# Patient Record
Sex: Female | Born: 1960 | Race: Black or African American | Hispanic: No | Marital: Married | Smoking: Never smoker
Health system: Southern US, Community
[De-identification: ages and names within clinical notes are randomized; demographics above are authoritative.]

## PROBLEM LIST (undated history)

## (undated) DIAGNOSIS — I10 Essential (primary) hypertension: Secondary | ICD-10-CM

## (undated) HISTORY — PX: ECTOPIC PREGNANCY SURGERY: SHX613

## (undated) HISTORY — PX: ABDOMINAL HYSTERECTOMY: SHX81

---

## 2013-07-30 DIAGNOSIS — J45909 Unspecified asthma, uncomplicated: Secondary | ICD-10-CM | POA: Insufficient documentation

## 2013-09-26 ENCOUNTER — Ambulatory Visit: Payer: Self-pay | Admitting: Family Medicine

## 2016-01-05 ENCOUNTER — Emergency Department
Admission: EM | Admit: 2016-01-05 | Discharge: 2016-01-05 | Disposition: A | Discharge status: Home / Self Care | Payer: Managed Care, Other (non HMO) | Attending: Emergency Medicine | Admitting: Emergency Medicine

## 2016-01-05 ENCOUNTER — Encounter: Payer: Self-pay | Admitting: Emergency Medicine

## 2016-01-05 ENCOUNTER — Emergency Department: Payer: Managed Care, Other (non HMO)

## 2016-01-05 DIAGNOSIS — I10 Essential (primary) hypertension: Secondary | ICD-10-CM | POA: Diagnosis not present

## 2016-01-05 DIAGNOSIS — R079 Chest pain, unspecified: Secondary | ICD-10-CM

## 2016-01-05 HISTORY — DX: Essential (primary) hypertension: I10

## 2016-01-05 LAB — BASIC METABOLIC PANEL
ANION GAP: 8 (ref 5–15)
BUN: 15 mg/dL (ref 6–20)
CHLORIDE: 97 mmol/L — AB (ref 101–111)
CO2: 27 mmol/L (ref 22–32)
Calcium: 9.9 mg/dL (ref 8.9–10.3)
Creatinine, Ser: 0.79 mg/dL (ref 0.44–1.00)
GFR calc non Af Amer: >60 mL/min (ref >60)
GLUCOSE: 104 mg/dL — AB (ref 65–99)
Potassium: 3.4 mmol/L — ABNORMAL LOW (ref 3.5–5.1)
Sodium: 132 mmol/L — ABNORMAL LOW (ref 135–145)

## 2016-01-05 LAB — CBC
HEMATOCRIT: 36.4 % (ref 35.0–47.0)
HEMOGLOBIN: 12.5 g/dL (ref 12.0–16.0)
MCH: 29.5 pg (ref 26.0–34.0)
MCHC: 34.4 g/dL (ref 32.0–36.0)
MCV: 85.7 fL (ref 80.0–100.0)
Platelets: 305 10*3/uL (ref 150–440)
RBC: 4.25 MIL/uL (ref 3.80–5.20)
RDW: 13.4 % (ref 11.5–14.5)
WBC: 5.5 10*3/uL (ref 3.6–11.0)

## 2016-01-05 LAB — TROPONIN I: Troponin I: <0.03 ng/mL (ref <0.03)

## 2016-01-05 NOTE — ED Triage Notes (Signed)
Patient ambulatory to triage with steady gait, without difficulty or distress noted; pt reports elevated BP at home with pain beneath left breast radiating into back; st BP 179/99 at home

## 2016-01-05 NOTE — ED Provider Notes (Signed)
[facility] Emergency Department Provider Note   ____________________________________________   First MD Initiated Contact with Patient 01/05/16 0451     (approximate)  I have reviewed the triage vital signs and the nursing notes.   HISTORY  Chief Complaint Hypertension    HPI Gloria Curry is a 55 y.o. female who comes into the hospital today with elevated blood pressure and chest pain. The patient reports her blood pressure went up and she had pain under her left breast. The patient reports that her back is hurting a little bit as well. She's been taking her blood pressure medication okay but she hasn't had new problems. The patient reports her pain is gone currently and her blood pressure is improved. The patient is a pressure medicine one today but was nervous when it was so high. The patient denies any shortness of breath, arm pain, sweats, dizziness, lightheadedness nausea or vomiting. The patient did take some Aleve for her pain but it didn't help with her pain much. The patient's primary care physician is Dr. Dyke BrackettBabboff the patient is here today for evaluation.The patient's blood pressure at home was 179/99.   Past Medical History:  Diagnosis Date  . Hypertension     There are no active problems to display for this patient.   Past Surgical History:  Procedure Laterality Date  . ABDOMINAL HYSTERECTOMY    . ECTOPIC PREGNANCY SURGERY      Prior to Admission medications   Not on File    Allergies Patient has no known allergies.  No family history on file.  Social History Social History  Substance Use Topics  . Smoking status: Never Smoker  . Smokeless tobacco: Never Used  . Alcohol use No    Review of Systems Constitutional: Hypertension Eyes: No visual changes. ENT: No sore throat. Cardiovascular:  chest pain. Respiratory: Denies shortness of breath. Gastrointestinal: No abdominal pain.  No nausea, no vomiting.  No diarrhea.  No  constipation. Genitourinary: Negative for dysuria. Musculoskeletal: Negative for back pain. Skin: Negative for rash. Neurological: Negative for headaches, focal weakness or numbness.  10-point ROS otherwise negative.  ____________________________________________   PHYSICAL EXAM:  VITAL SIGNS: ED Triage Vitals  Enc Vitals Group     BP 01/05/16 0115 (!) 192/91     Pulse Rate 01/05/16 0115 84     Resp 01/05/16 0115 18     Temp 01/05/16 0115 97.8 F (36.6 C)     Temp Source 01/05/16 0115 Oral     SpO2 01/05/16 0115 100 %     Weight 01/05/16 0116 125 lb (56.7 kg)     Height 01/05/16 0116 5\' 2"  (1.575 m)     Head Circumference --      Peak Flow --      Pain Score 01/05/16 0115 7     Pain Loc --      Pain Edu? --      Excl. in [place]? --     Constitutional: Alert and oriented. Well appearing and in no acute distress. Eyes: Conjunctivae are normal. PERRL. EOMI. Head: Atraumatic. Nose: No congestion/rhinnorhea. Mouth/Throat: Mucous membranes are moist.  Oropharynx non-erythematous. Cardiovascular: Normal rate, regular rhythm. Grossly normal heart sounds.  Good peripheral circulation. Respiratory: Normal respiratory effort.  No retractions. Lungs CTAB. Gastrointestinal: Soft and nontender. No distention. Positive bowel sounds Musculoskeletal: No lower extremity tenderness nor edema.   Neurologic:  Normal speech and language.  Skin:  Skin is warm, dry and intact.  Psychiatric: Mood and affect  are normal.   ____________________________________________   LABS (all labs ordered are listed, but only abnormal results are displayed)  Labs Reviewed  BASIC METABOLIC PANEL - Abnormal; Notable for the following:       Result Value   Sodium 132 (*)    Potassium 3.4 (*)    Chloride 97 (*)    Glucose, Bld 104 (*)    All other components within normal limits  CBC  TROPONIN I  TROPONIN I   ____________________________________________  EKG  ED ECG REPORT I, Rebecka ApleyWebster,  Cristle Jared P,  the attending physician, personally viewed and interpreted this ECG.   Date: 01/05/2016  EKG Time: 130  Rate: 88  Rhythm: normal sinus rhythm  Axis: normal  Intervals:none  ST&T Change: none  ____________________________________________  RADIOLOGY  CXR ____________________________________________   PROCEDURES  Procedure(s) performed: None  Procedures  Critical Care performed: No  ____________________________________________   INITIAL IMPRESSION / ASSESSMENT AND PLAN / ED COURSE  Pertinent labs & imaging results that were available during my care of the patient were reviewed by me and considered in my medical decision making (see chart for details).  This is a 55 year old female who comes into the hospital today with some elevated blood pressure and chest pain. The patient's initial blood work is unremarkable and her blood pressure is improved. The patient's chest pain is also improved at this time. I will repeat the patient's troponin and if her blood pressure remains unremarkable she'll be discharged home.  Clinical Course    Chest x-ray: No active cardiopulmonary disease  The patient's chest pain is improved and her blood pressure is still in the 130s over 90s. She'll be discharged home to follow-up with her primary care physician. The patient has no further complaints or concerns at this time. She'll be discharged.  ____________________________________________   FINAL CLINICAL IMPRESSION(S) / ED DIAGNOSES  Final diagnoses:  Chest pain, unspecified type  Hypertension, unspecified type      NEW MEDICATIONS STARTED DURING THIS VISIT:  There are no discharge medications for this patient.    Note:  This document was prepared using Dragon voice recognition software and may include unintentional dictation errors.    Rebecka ApleyAllison P Henery Betzold, MD 01/05/16 [phone]

## 2016-10-31 ENCOUNTER — Other Ambulatory Visit: Payer: Self-pay | Admitting: Family Medicine

## 2016-10-31 DIAGNOSIS — Z1231 Encounter for screening mammogram for malignant neoplasm of breast: Secondary | ICD-10-CM

## 2016-11-10 ENCOUNTER — Ambulatory Visit
Admission: RE | Admit: 2016-11-10 | Discharge: 2016-11-10 | Disposition: A | Discharge status: Home / Self Care | Payer: Managed Care, Other (non HMO) | Source: Ambulatory Visit | Attending: Family Medicine | Admitting: Family Medicine

## 2016-11-10 DIAGNOSIS — Z1231 Encounter for screening mammogram for malignant neoplasm of breast: Secondary | ICD-10-CM | POA: Diagnosis not present

## 2017-11-27 ENCOUNTER — Other Ambulatory Visit: Payer: Self-pay | Admitting: Family Medicine

## 2017-11-27 DIAGNOSIS — Z1231 Encounter for screening mammogram for malignant neoplasm of breast: Secondary | ICD-10-CM

## 2017-12-30 IMAGING — CR DG CHEST 2V
2 series · 3 of 3 positions shown · non-contrast
Comparison: None.

CLINICAL DATA: Elevated BP pain beneath the left breast

EXAM:
CHEST  2 VIEW

[chest pa]
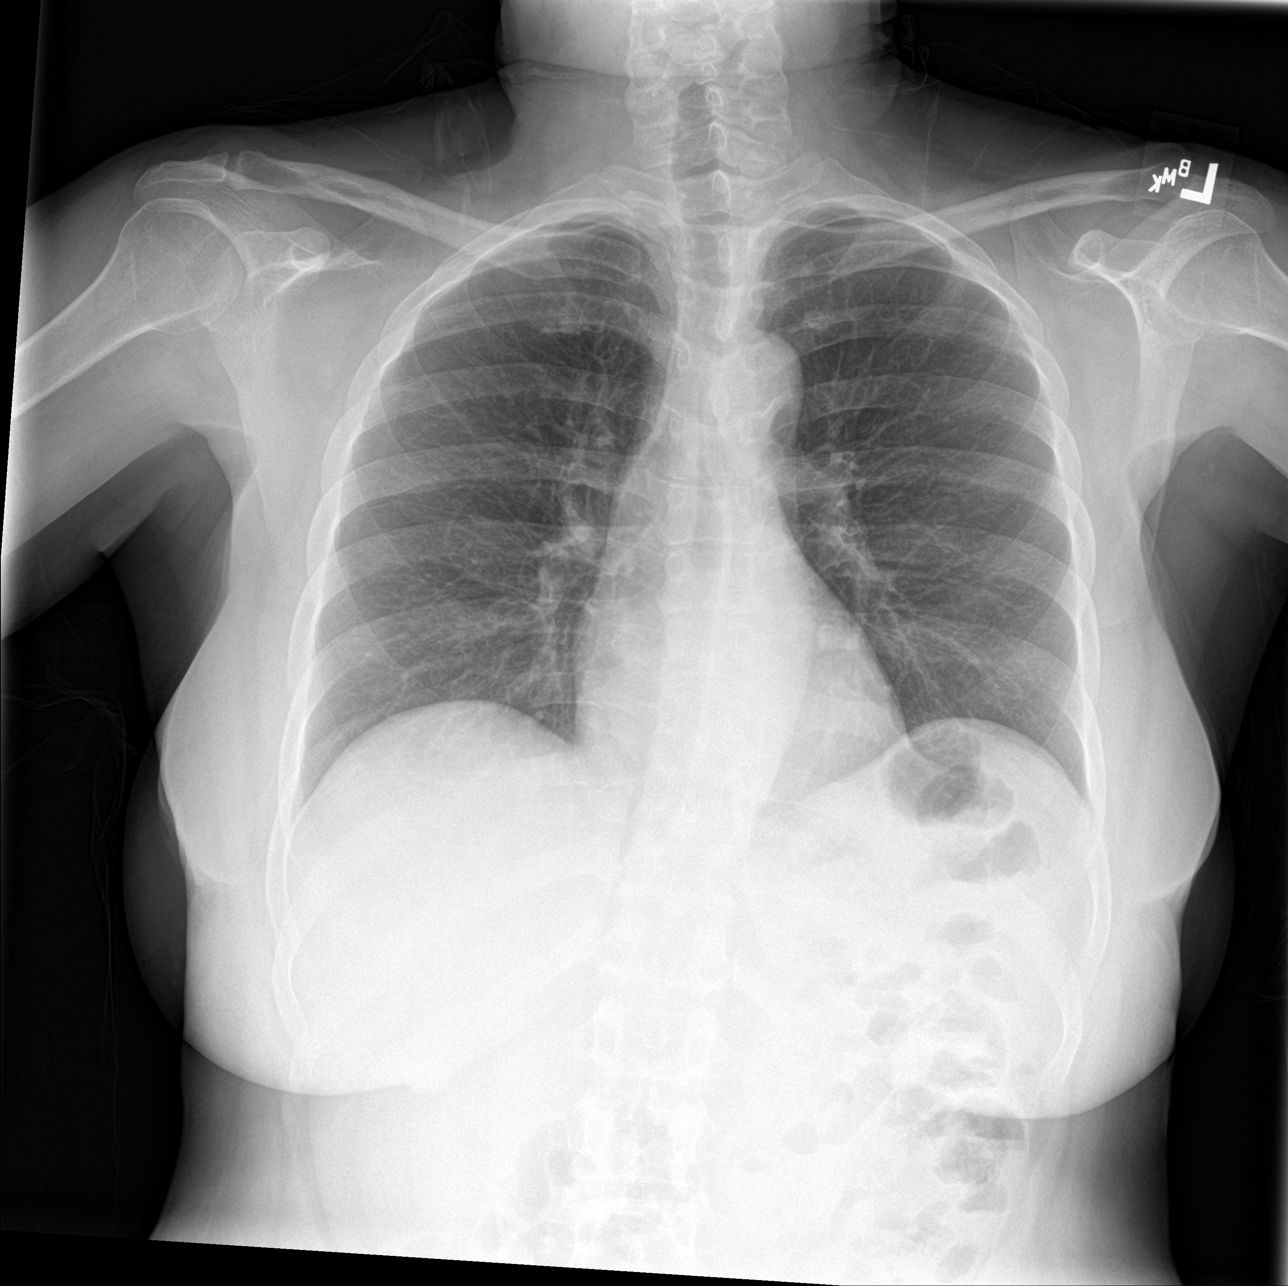

[Series 2: chest lat · 0.14mm/px · 2 of 2 slices shown]
[im 1/2]
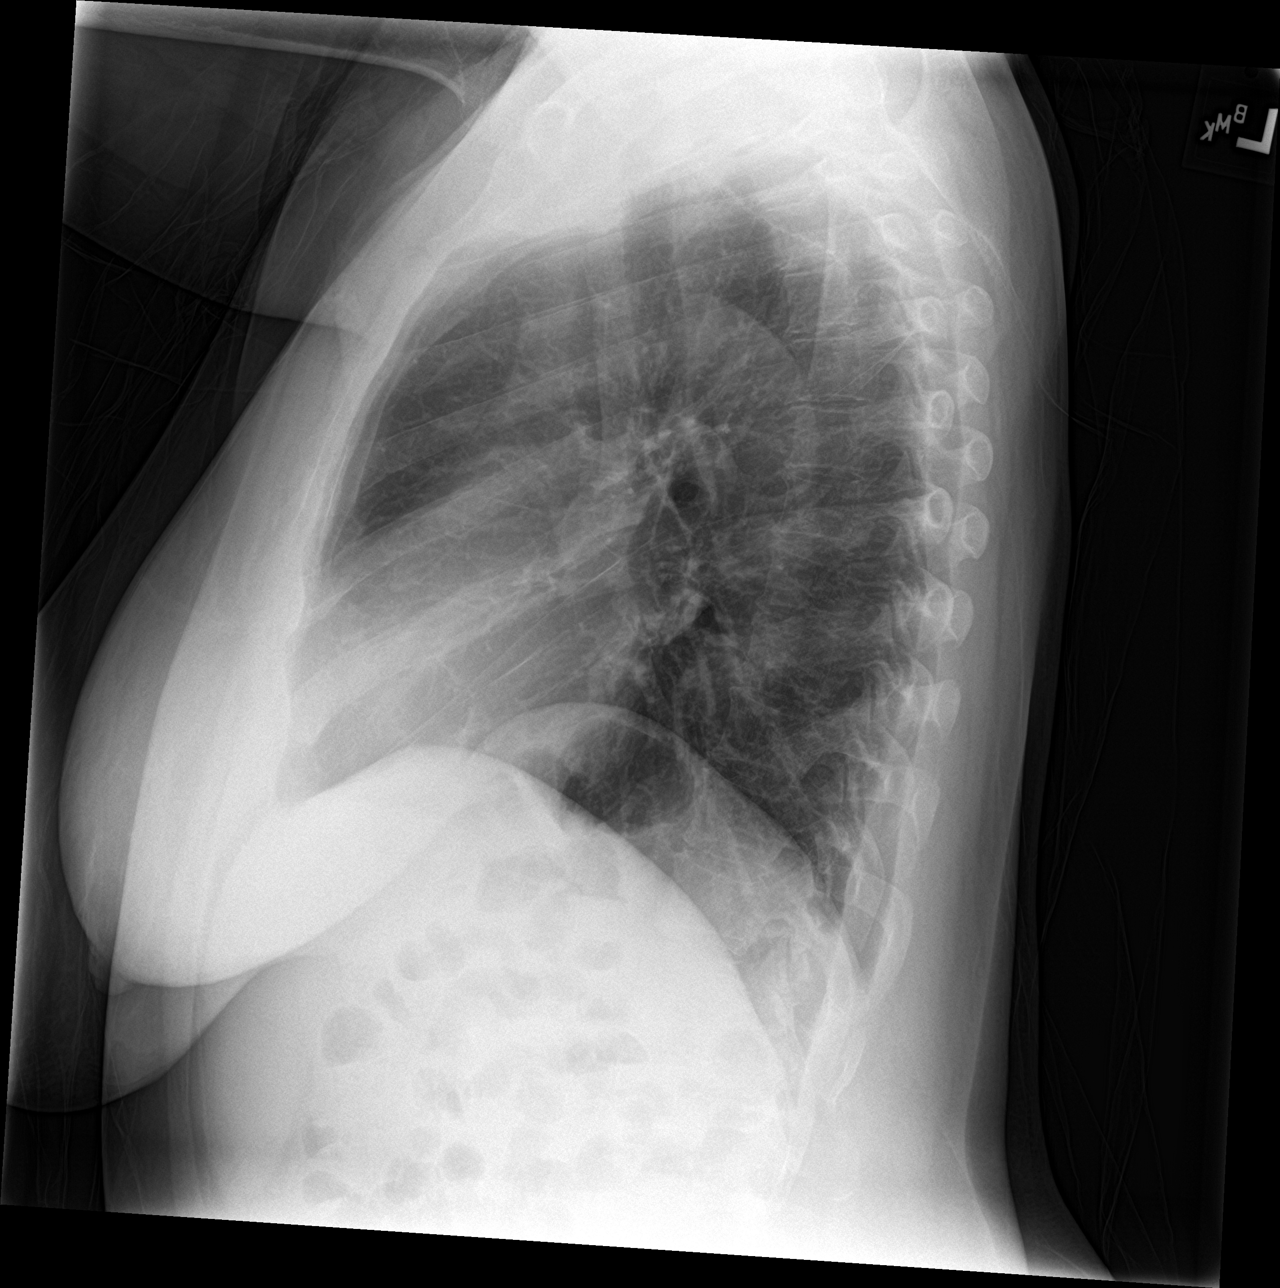
[im 2/2]
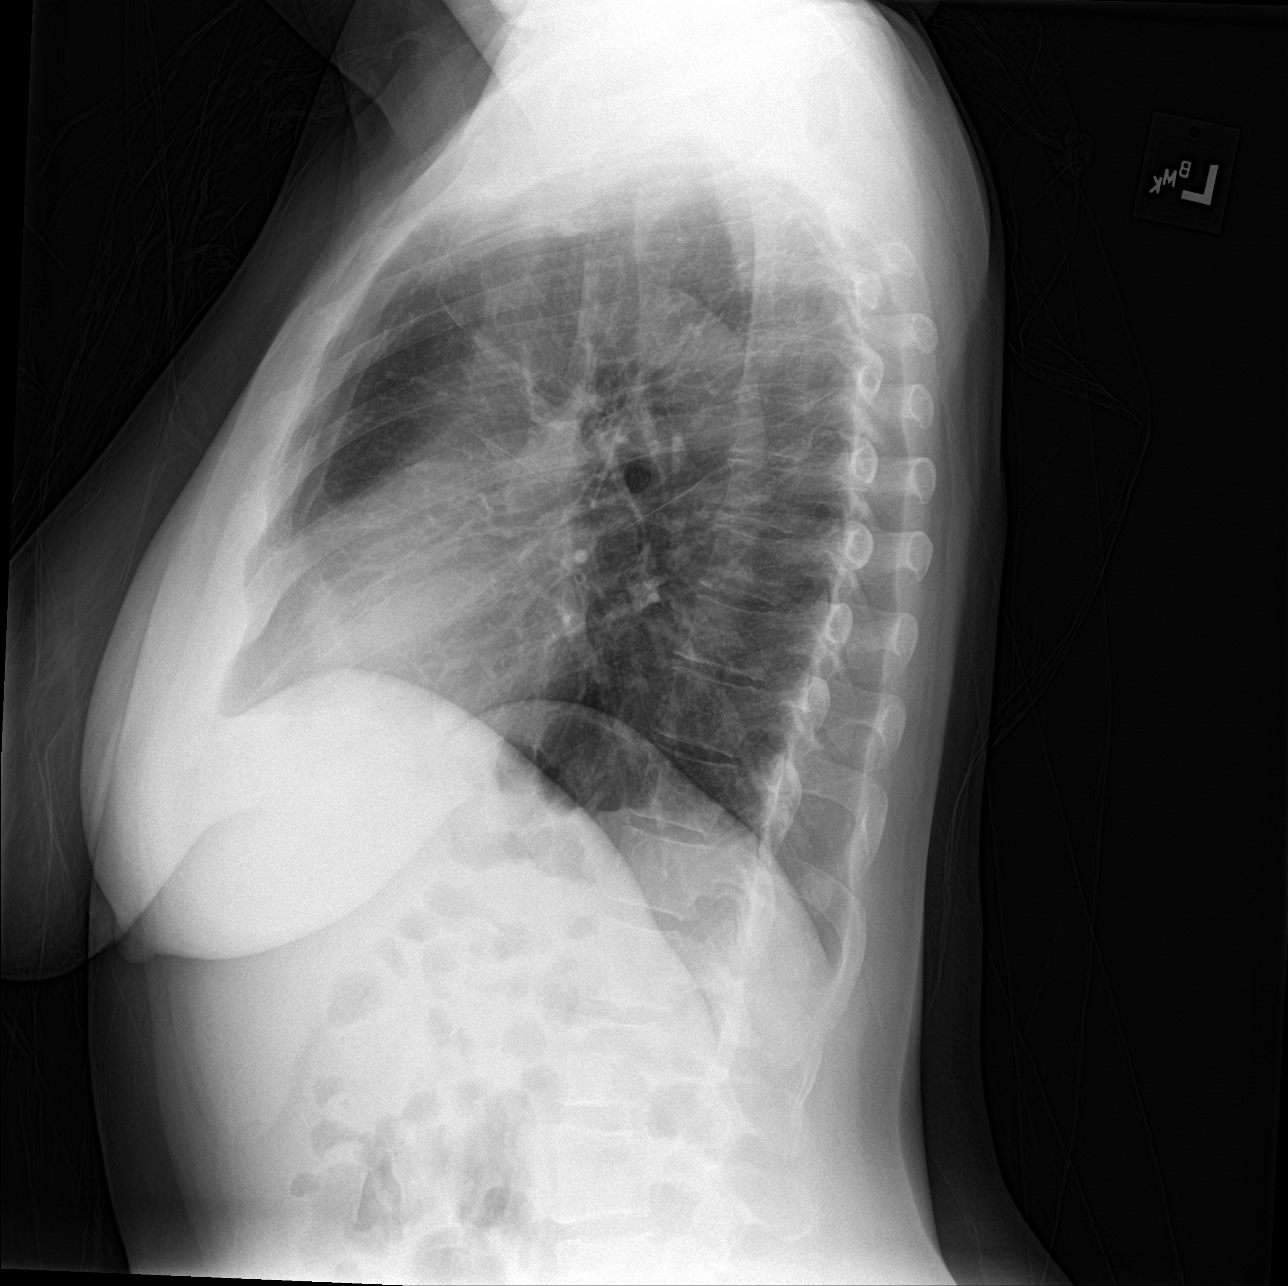

[3 of 3 positions shown; findings below may reference images not displayed]

FINDINGS: The heart size and mediastinal contours are within normal limits.
Both lungs are clear. The visualized skeletal structures are
unremarkable.
IMPRESSION: No active cardiopulmonary disease.

## 2018-03-22 ENCOUNTER — Ambulatory Visit
Admission: RE | Admit: 2018-03-22 | Discharge: 2018-03-22 | Disposition: A | Discharge status: Home / Self Care | Payer: Managed Care, Other (non HMO) | Source: Ambulatory Visit | Attending: Family Medicine | Admitting: Family Medicine

## 2018-03-22 DIAGNOSIS — Z1231 Encounter for screening mammogram for malignant neoplasm of breast: Secondary | ICD-10-CM

## 2019-02-18 ENCOUNTER — Other Ambulatory Visit: Payer: Self-pay | Admitting: Family Medicine

## 2019-02-18 DIAGNOSIS — Z1231 Encounter for screening mammogram for malignant neoplasm of breast: Secondary | ICD-10-CM

## 2019-04-16 ENCOUNTER — Ambulatory Visit
Admission: RE | Admit: 2019-04-16 | Discharge: 2019-04-16 | Disposition: A | Discharge status: Home / Self Care | Payer: Managed Care, Other (non HMO) | Source: Ambulatory Visit | Attending: Family Medicine | Admitting: Family Medicine

## 2019-04-16 DIAGNOSIS — Z1231 Encounter for screening mammogram for malignant neoplasm of breast: Secondary | ICD-10-CM | POA: Diagnosis not present

## 2020-06-01 ENCOUNTER — Other Ambulatory Visit: Payer: Self-pay | Admitting: Family Medicine

## 2020-06-01 DIAGNOSIS — Z1231 Encounter for screening mammogram for malignant neoplasm of breast: Secondary | ICD-10-CM

## 2020-06-09 ENCOUNTER — Other Ambulatory Visit: Payer: Self-pay

## 2020-06-09 ENCOUNTER — Ambulatory Visit
Admission: RE | Admit: 2020-06-09 | Discharge: 2020-06-09 | Disposition: A | Discharge status: Home / Self Care | Payer: Managed Care, Other (non HMO) | Source: Ambulatory Visit | Attending: Family Medicine | Admitting: Family Medicine

## 2020-06-09 DIAGNOSIS — Z1231 Encounter for screening mammogram for malignant neoplasm of breast: Secondary | ICD-10-CM | POA: Diagnosis not present

## 2021-04-03 ENCOUNTER — Ambulatory Visit
Admission: RE | Admit: 2021-04-03 | Discharge: 2021-04-03 | Disposition: A | Discharge status: Home / Self Care | Payer: Managed Care, Other (non HMO) | Source: Ambulatory Visit

## 2021-04-03 ENCOUNTER — Other Ambulatory Visit: Payer: Self-pay

## 2021-04-03 VITALS — BP 153/93 | HR 79 | Temp 98.1°F | Resp 14 | Ht 61.0 in | Wt 130.0 lb

## 2021-04-03 DIAGNOSIS — Z112 Encounter for screening for other bacterial diseases: Secondary | ICD-10-CM

## 2021-04-03 DIAGNOSIS — J029 Acute pharyngitis, unspecified: Secondary | ICD-10-CM

## 2021-04-03 LAB — GROUP A STREP BY PCR: Group A Strep by PCR: NOT DETECTED

## 2021-04-03 MED ORDER — PREDNISONE 20 MG PO TABS: 40.0000 mg | ORAL_TABLET | Freq: Every day | ORAL | 0 refills | Status: AC

## 2021-04-03 NOTE — ED Provider Notes (Signed)
[facility]    CSN: [id number] Arrival date & time: 04/03/21  0851      History   Chief Complaint Chief Complaint  Patient presents with   Otalgia   Sore Throat   Appointment    HPI Gloria Curry is a 61 y.o. female presenting with sore throat and right ear pressure for 4 days.  Medical history noncontributory.  Describes sore throat, postnasal drip, right ear pressure.  Denies hearing changes, dizziness, tinnitus.  Denies fever/chills.  Has attempted over-the-counter medications without relief.  Denies known sick exposure.  HPI  Past Medical History:  Diagnosis Date   Hypertension     There are no problems to display for this patient.   Past Surgical History:  Procedure Laterality Date   ABDOMINAL HYSTERECTOMY     ECTOPIC PREGNANCY SURGERY      OB History   No obstetric history on file.      Home Medications    Prior to Admission medications   Medication Sig Start Date End Date Taking? Authorizing Provider  albuterol (VENTOLIN HFA) 108 (90 Base) MCG/ACT inhaler USE 2 INHALATIONS EVERY 4 HOURS AS NEEDED FOR WHEEZING 08/31/12  Yes [provider]  amLODipine (NORVASC) 5 MG tablet Take 1 tablet by mouth daily. 03/18/20  Yes [provider]  atorvastatin (LIPITOR) 10 MG tablet Take 1 tablet by mouth daily. 03/18/20  Yes [provider]  lisinopril (ZESTRIL) 20 MG tablet Take 1 tablet by mouth daily. 03/18/20  Yes [provider]  montelukast (SINGULAIR) 10 MG tablet Take by mouth. 04/16/20  Yes [provider]  Multiple Vitamin (MULTI-VITAMIN) tablet Take 1 tablet by mouth daily.   Yes [provider]  predniSONE (DELTASONE) 20 MG tablet Take 2 tablets (40 mg total) by mouth daily for 5 days. Take with breakfast or lunch. Avoid NSAIDs (ibuprofen, etc) while taking this medication. 04/03/21 04/08/21 Yes Hazel Sams, PA-C    Family History History reviewed. No pertinent family history.  Social  History Social History   Tobacco Use   Smoking status: Never   Smokeless tobacco: Never  Vaping Use   Vaping Use: Never used  Substance Use Topics   Alcohol use: No   Drug use: Never     Allergies   Patient has no known allergies.   Review of Systems Review of Systems  Constitutional:  Negative for appetite change, chills and fever.  HENT:  Positive for ear pain and sore throat. Negative for congestion, rhinorrhea, sinus pressure and sinus pain.   Eyes:  Negative for redness and visual disturbance.  Respiratory:  Negative for cough, chest tightness, shortness of breath and wheezing.   Cardiovascular:  Negative for chest pain and palpitations.  Gastrointestinal:  Negative for abdominal pain, constipation, diarrhea, nausea and vomiting.  Genitourinary:  Negative for dysuria, frequency and urgency.  Musculoskeletal:  Negative for myalgias.  Neurological:  Negative for dizziness, weakness and headaches.  Psychiatric/Behavioral:  Negative for confusion.   All other systems reviewed and are negative.   Physical Exam Triage Vital Signs ED Triage Vitals  Enc Vitals Group     BP 04/03/21 0901 (!) 153/93     Pulse Rate 04/03/21 0901 79     Resp 04/03/21 0901 14     Temp 04/03/21 0901 98.1 F (36.7 C)     Temp Source 04/03/21 0901 Oral     SpO2 04/03/21 0901 100 %     Weight 04/03/21 0857 130 lb (59 kg)  Height 04/03/21 0857 5\' 1"  (1.549 m)     Head Circumference --      Peak Flow --      Pain Score 04/03/21 0857 7     Pain Loc --      Pain Edu? --      Excl. in [place]? --    No data found.  Updated Vital Signs BP (!) 153/93 (BP Location: Right Arm)    Pulse 79    Temp 98.1 F (36.7 C) (Oral)    Resp 14    Ht 5\' 1"  (1.549 m)    Wt 130 lb (59 kg)    SpO2 100%    BMI 24.56 kg/m   Visual Acuity Right Eye Distance:   Left Eye Distance:   Bilateral Distance:    Right Eye Near:   Left Eye Near:    Bilateral Near:     Physical Exam Vitals reviewed.  Constitutional:       Appearance: Normal appearance. She is not ill-appearing.  HENT:     Head: Normocephalic and atraumatic.     Right Ear: Hearing, tympanic membrane, ear canal and external ear normal. No swelling or tenderness. No middle ear effusion. There is impacted cerumen. No mastoid tenderness. Tympanic membrane is not injected, scarred, perforated, erythematous, retracted or bulging.     Left Ear: Hearing, tympanic membrane, ear canal and external ear normal. No swelling or tenderness.  No middle ear effusion. There is impacted cerumen. No mastoid tenderness. Tympanic membrane is not injected, scarred, perforated, erythematous, retracted or bulging.     Ears:     Comments: Bilateral TMs occluded 70% by cerumen. TM appears healthy and intact and there is no tenderness on exam.     Mouth/Throat:     Pharynx: Oropharynx is clear. Posterior oropharyngeal erythema present. No oropharyngeal exudate.     Comments: Erythema and cobblestoning posterior pharynx. Tonsils are small.On exam, uvula is midline, she is tolerating her secretions without difficulty, there is no trismus, no drooling, she has normal phonation  Cardiovascular:     Rate and Rhythm: Normal rate and regular rhythm.     Heart sounds: Normal heart sounds.  Pulmonary:     Effort: Pulmonary effort is normal.     Breath sounds: Normal breath sounds.  Lymphadenopathy:     Cervical: No cervical adenopathy.     Right cervical: No superficial cervical adenopathy.    Left cervical: No superficial cervical adenopathy.  Neurological:     General: No focal deficit present.     Mental Status: She is alert and oriented to person, place, and time.  Psychiatric:        Mood and Affect: Mood normal.        Behavior: Behavior normal.        Thought Content: Thought content normal.        Judgment: Judgment normal.     UC Treatments / Results  Labs (all labs ordered are listed, but only abnormal results are displayed) Labs Reviewed  GROUP A STREP  BY PCR    EKG   Radiology No results found.  Procedures Procedures (including critical care time)  Medications Ordered in UC Medications - No data to display  Initial Impression / Assessment and Plan / UC Course  I have reviewed the triage vital signs and the nursing notes.  Pertinent labs & imaging results that were available during my care of the patient were reviewed by me and considered in my medical decision making (  see chart for details).     This patient is a very pleasant 61 y.o. year old female presenting with viral pharyngitis. Afebrile, nontachy.  Strep PCR negative.   Will manage with low-dose prednisone as below.   ED return precautions discussed. Patient verbalizes understanding and agreement.     Final Clinical Impressions(s) / UC Diagnoses   Final diagnoses:  Viral pharyngitis  Screening for streptococcal infection     Discharge Instructions      -Prednisone, 2 pills taken at the same time for 5 days in a row.  Try taking this earlier in the day as it can give you energy. Avoid NSAIDs like ibuprofen and alleve while taking this medication as they can increase your risk of stomach upset and even GI bleeding when in combination with a steroid. You can continue tylenol (acetaminophen) up to 1000mg  3x daily. -With a virus, you're typically contagious for 5-7 days, or as long as you're having fevers.  -Continue over-the-counter medications for additional relief  -Follow-up if symptoms worsen instead of improve - facial pressure, sore throat, new shortness of breath, new chest pain    ED Prescriptions     Medication Sig Dispense Auth. Provider   predniSONE (DELTASONE) 20 MG tablet Take 2 tablets (40 mg total) by mouth daily for 5 days. Take with breakfast or lunch. Avoid NSAIDs (ibuprofen, etc) while taking this medication. 10 tablet Hazel Sams, PA-C      PDMP not reviewed this encounter.   Hazel Sams, PA-C 04/03/21 1004

## 2021-04-03 NOTE — Discharge Instructions (Addendum)
-  Prednisone, 2 pills taken at the same time for 5 days in a row.  Try taking this earlier in the day as it can give you energy. Avoid NSAIDs like ibuprofen and alleve while taking this medication as they can increase your risk of stomach upset and even GI bleeding when in combination with a steroid. You can continue tylenol (acetaminophen) up to 1000mg  3x daily. -With a virus, you're typically contagious for 5-7 days, or as long as you're having fevers.  -Continue over-the-counter medications for additional relief  -Follow-up if symptoms worsen instead of improve - facial pressure, sore throat, new shortness of breath, new chest pain

## 2021-04-03 NOTE — ED Triage Notes (Signed)
Patient c/o right ear pain and sore throat that started on Tuesday.  Patient states that she has had cold symptoms for the past 2 weeks prior to these new symptoms. Patient denies fevers,.

## 2021-04-10 IMAGING — MG DIGITAL SCREENING BILAT W/ TOMO W/ CAD
8 series · 8 of 24 positions shown · non-contrast
Comparison: Previous exam(s).

CLINICAL DATA: Screening.

EXAM:
DIGITAL SCREENING BILATERAL MAMMOGRAM WITH TOMO AND CAD

[R MLO synth-2D]
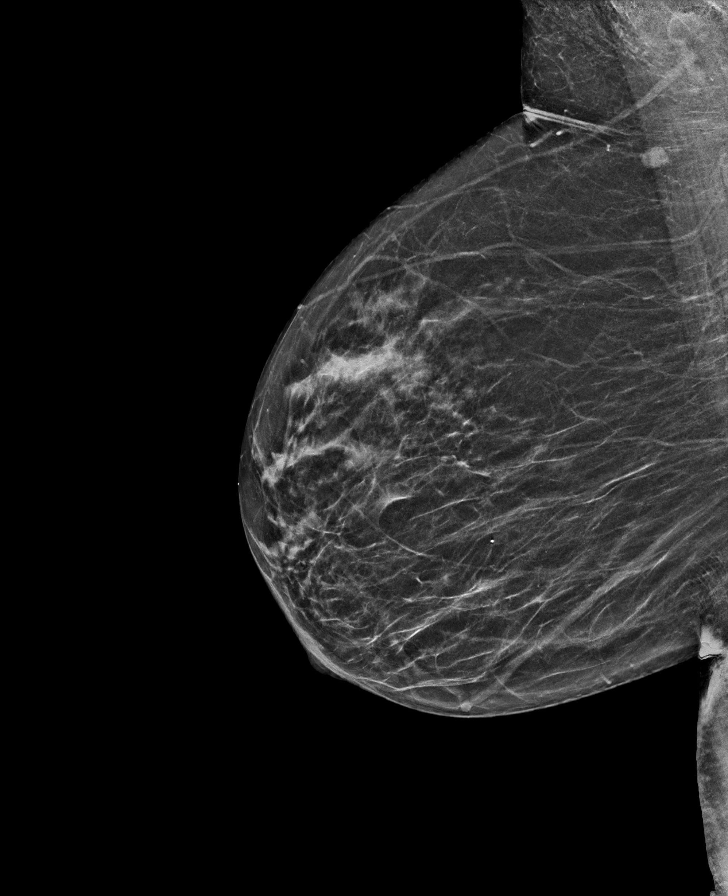

[R CC synth-2D]
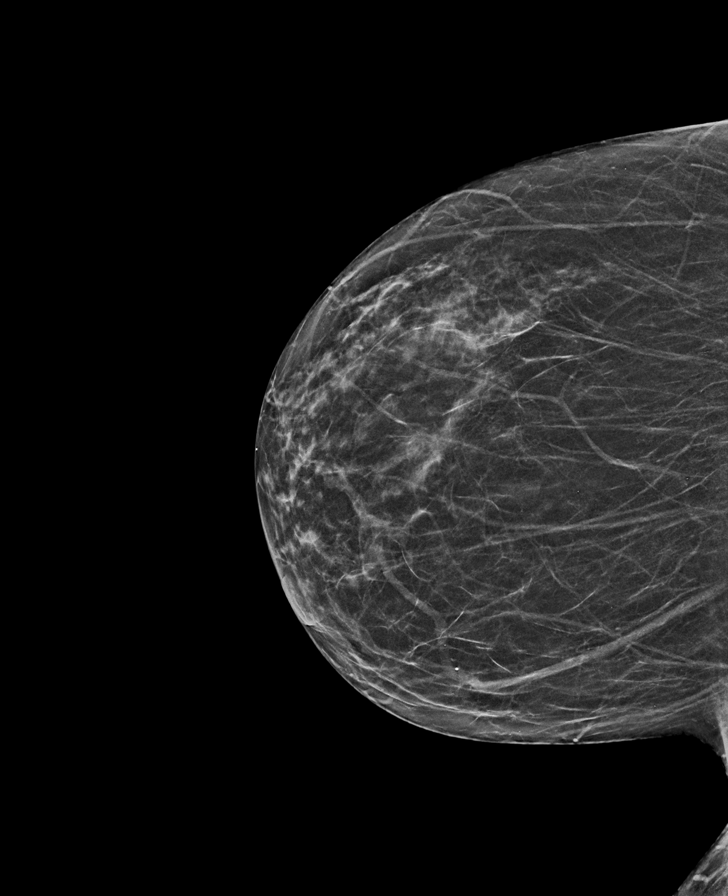

[L MLO synth-2D]
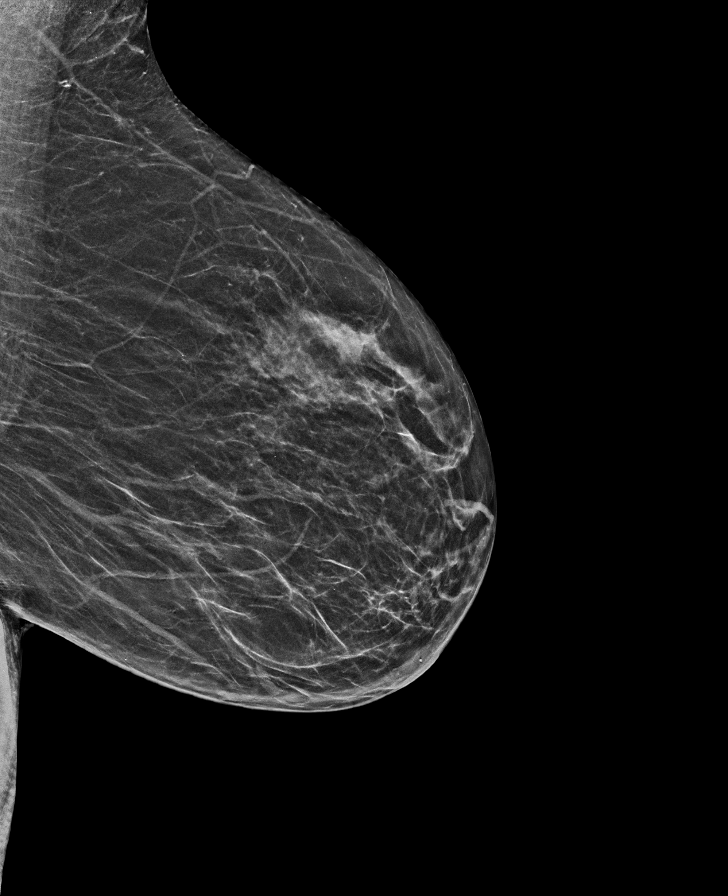

[L CC synth-2D]
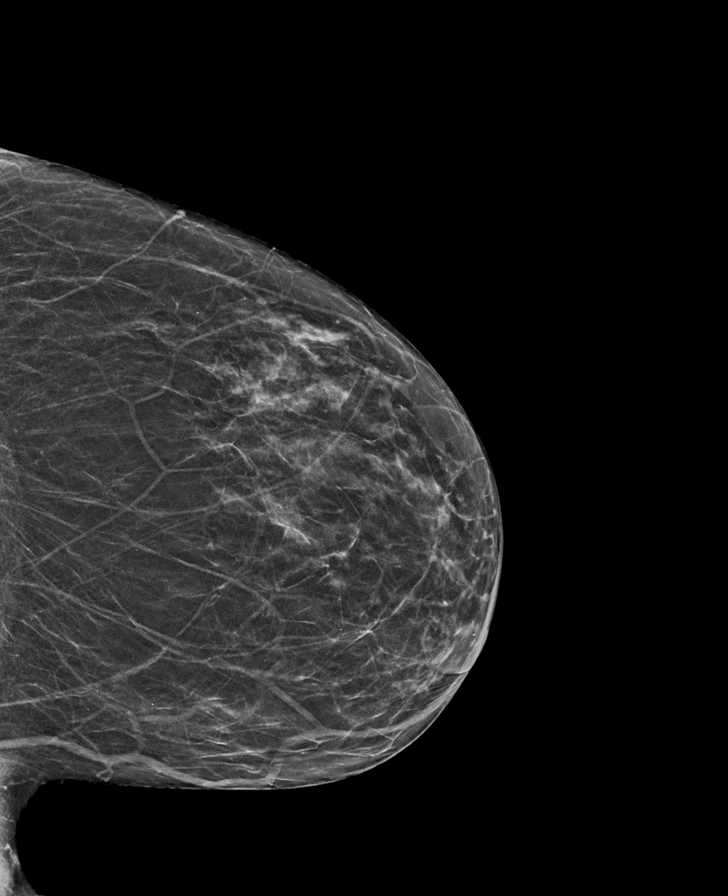

[R MLO tomo · tomo slice 27/53.0]
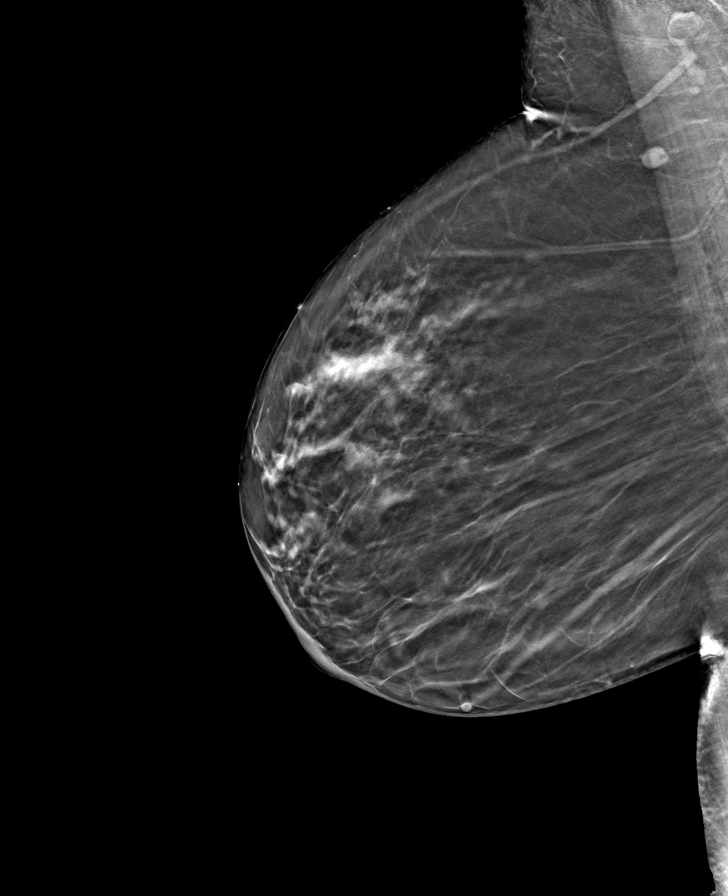

[L MLO tomo · tomo slice 25/50.0]
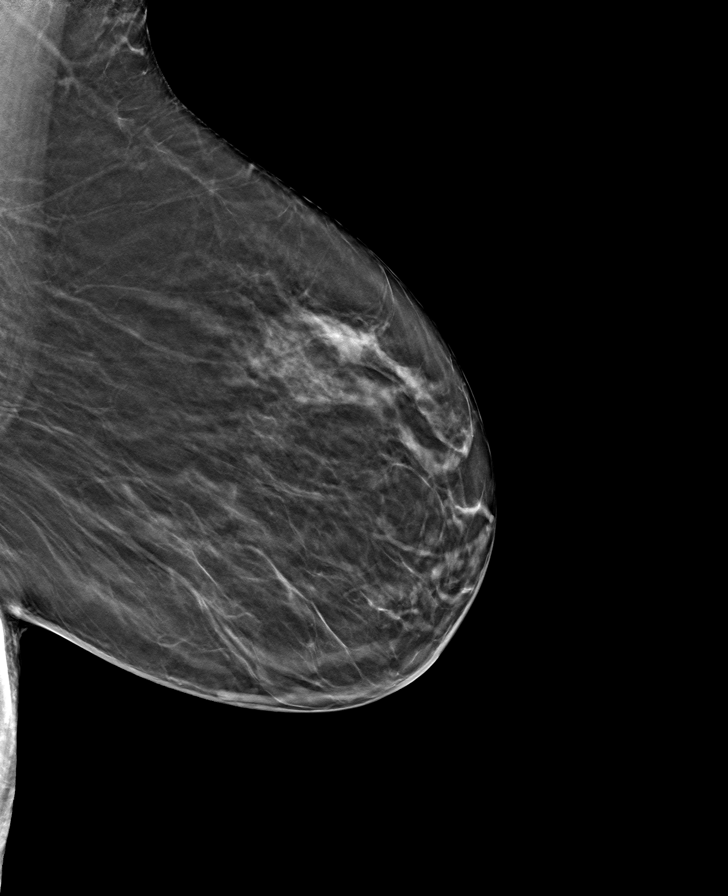

[L CC tomo · tomo slice 23/46.0]
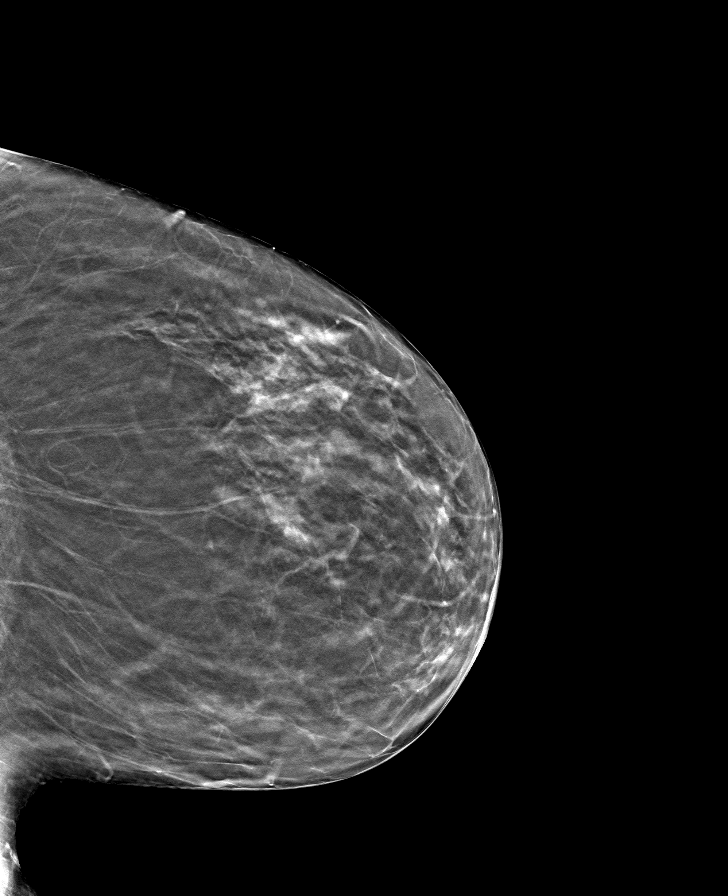

[R CC tomo · tomo slice 25/49.0]
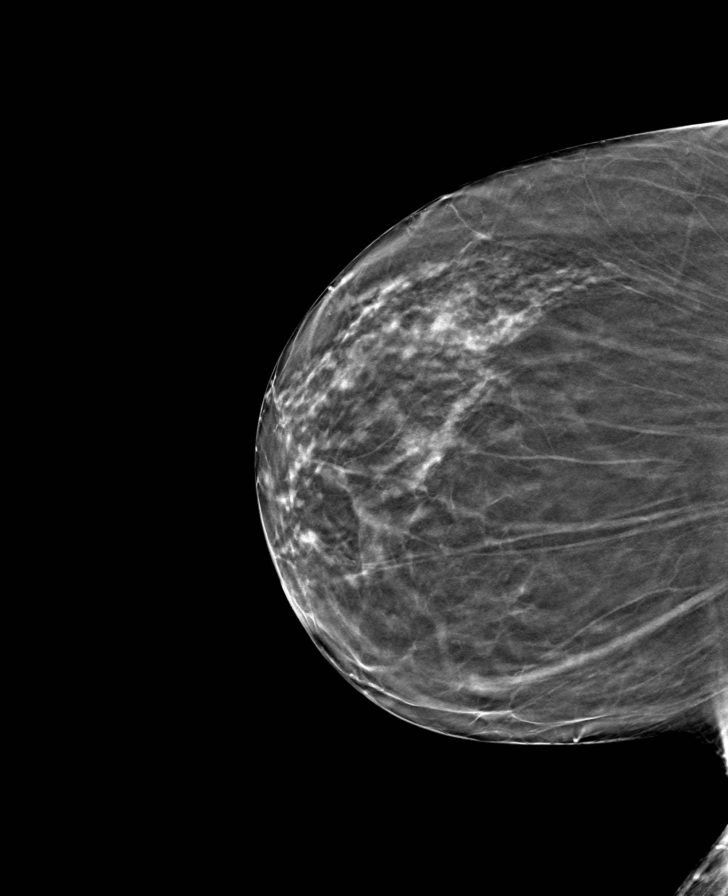

[8 of 24 positions shown; findings below may reference images not displayed]

ACR Breast Density Category b: There are scattered areas of
fibroglandular density.
FINDINGS: There are no findings suspicious for malignancy. Images were
processed with CAD.
IMPRESSION: No mammographic evidence of malignancy. A result letter of this
screening mammogram will be mailed directly to the patient.

RECOMMENDATION:
Screening mammogram in one year. (Code:[id number])

BI-RADS CATEGORY  1: Negative.

## 2021-07-26 ENCOUNTER — Other Ambulatory Visit: Payer: Self-pay | Admitting: Family Medicine

## 2021-07-26 DIAGNOSIS — Z1231 Encounter for screening mammogram for malignant neoplasm of breast: Secondary | ICD-10-CM

## 2021-09-07 ENCOUNTER — Ambulatory Visit
Admission: RE | Admit: 2021-09-07 | Discharge: 2021-09-07 | Disposition: A | Discharge status: Home / Self Care | Payer: Managed Care, Other (non HMO) | Source: Ambulatory Visit | Attending: Family Medicine | Admitting: Family Medicine

## 2021-09-07 DIAGNOSIS — Z1231 Encounter for screening mammogram for malignant neoplasm of breast: Secondary | ICD-10-CM | POA: Diagnosis not present

## 2022-06-22 ENCOUNTER — Ambulatory Visit: Payer: Self-pay

## 2022-06-23 ENCOUNTER — Ambulatory Visit
Admission: RE | Admit: 2022-06-23 | Discharge: 2022-06-23 | Disposition: A | Discharge status: Home / Self Care | Payer: Managed Care, Other (non HMO) | Source: Ambulatory Visit

## 2022-06-23 VITALS — BP 174/84 | HR 94 | Temp 98.5°F | Resp 16

## 2022-06-23 DIAGNOSIS — S56912A Strain of unspecified muscles, fascia and tendons at forearm level, left arm, initial encounter: Secondary | ICD-10-CM

## 2022-06-23 NOTE — ED Provider Notes (Signed)
[facility]    CSN: [id number] Arrival date & time: 06/23/22  7829      History   Chief Complaint Chief Complaint  Patient presents with   Arm Pain    HPI Gloria Curry is a 62 y.o. female.   HPI  58 old female with a past medical history significant for hypertension presents for evaluation of pain in the muscles of her left forearm has been going on for the past week.  Occasionally the pain will go into her fingertips but it does not present currently.  She has noticed some interval change in her grip strength as well.  She states that she has used over-the-counter Tylenol and Aleve with minimal relief of symptoms.  She denies any injury though she does report that she types a lot on the computer.  Past Medical History:  Diagnosis Date   Hypertension     There are no problems to display for this patient.   Past Surgical History:  Procedure Laterality Date   ABDOMINAL HYSTERECTOMY     ECTOPIC PREGNANCY SURGERY      OB History   No obstetric history on file.      Home Medications    Prior to Admission medications   Medication Sig Start Date End Date Taking? Authorizing Provider  albuterol (VENTOLIN HFA) 108 (90 Base) MCG/ACT inhaler USE 2 INHALATIONS EVERY 4 HOURS AS NEEDED FOR WHEEZING 08/31/12  Yes [provider]  amLODipine (NORVASC) 5 MG tablet Take 1 tablet by mouth daily. 03/18/20  Yes [provider]  atorvastatin (LIPITOR) 10 MG tablet Take 1 tablet by mouth daily. 03/18/20  Yes [provider]  lisinopril (ZESTRIL) 20 MG tablet Take 1 tablet by mouth daily. 03/18/20  Yes [provider]  meclizine (ANTIVERT) 25 MG tablet Take by mouth. 11/06/09  Yes [provider]  montelukast (SINGULAIR) 10 MG tablet Take by mouth. 04/16/20  Yes [provider]  Multiple Vitamin (MULTI-VITAMIN) tablet Take 1 tablet by mouth daily.   Yes [provider]  QVAR REDIHALER 80 MCG/ACT inhaler Inhale into the  lungs. 06/22/22  Yes [provider]    Family History No family history on file.  Social History Social History   Tobacco Use   Smoking status: Never   Smokeless tobacco: Never  Vaping Use   Vaping Use: Never used  Substance Use Topics   Alcohol use: No   Drug use: Never     Allergies   Patient has no known allergies.   Review of Systems Review of Systems  Constitutional:  Negative for fever.  Musculoskeletal:  Positive for myalgias.  Skin:  Negative for color change.  Neurological:  Positive for weakness and numbness.     Physical Exam Triage Vital Signs ED Triage Vitals  Enc Vitals Group     BP 06/23/22 0959 (!) 174/84     Pulse Rate 06/23/22 0959 94     Resp 06/23/22 0959 16     Temp 06/23/22 0959 98.5 F (36.9 C)     Temp Source 06/23/22 0959 Oral     SpO2 06/23/22 0959 98 %     Weight --      Height --      Head Circumference --      Peak Flow --      Pain Score 06/23/22 0957 5     Pain Loc --      Pain Edu? --      Excl. in [place]? --  No data found.  Updated Vital Signs BP (!) 174/84 (BP Location: Left Arm)   Pulse 94   Temp 98.5 F (36.9 C) (Oral)   Resp 16   SpO2 98%   Visual Acuity Right Eye Distance:   Left Eye Distance:   Bilateral Distance:    Right Eye Near:   Left Eye Near:    Bilateral Near:     Physical Exam Vitals and nursing note reviewed.  Constitutional:      Appearance: Normal appearance. She is not ill-appearing.  HENT:     Head: Normocephalic and atraumatic.  Musculoskeletal:        General: Tenderness present. No swelling, deformity or signs of injury. Normal range of motion.     Comments: The patient has tenderness with palpation of the muscles on the volar aspect of the forearm but no palpable defect there is no appreciable edema, ecchymosis, or erythema.  Negative Tinel's and Phalen sign.  Skin:    General: Skin is warm and dry.     Capillary Refill: Capillary refill takes less than 2 seconds.      Findings: No bruising or erythema.  Neurological:     General: No focal deficit present.     Mental Status: She is alert and oriented to person, place, and time.     Sensory: No sensory deficit.     Motor: No weakness.      UC Treatments / Results  Labs (all labs ordered are listed, but only abnormal results are displayed) Labs Reviewed - No data to display  EKG   Radiology No results found.  Procedures Procedures (including critical care time)  Medications Ordered in UC Medications - No data to display  Initial Impression / Assessment and Plan / UC Course  I have reviewed the triage vital signs and the nursing notes.  Pertinent labs & imaging results that were available during my care of the patient were reviewed by me and considered in my medical decision making (see chart for details).   Patient is a pleasant, nontoxic-appearing 36 old female presenting for evaluation of pain to the muscles of the left forearm as outlined in HPI above.  Her Tinel's and Phalen's test are negative though she does have mild tenderness to palpation of the muscle groups of the forearm on the volar aspect.  She reports that she has not done any heavy lifting and she denies any injury, though she does state that she types a lot on the computer.  I suspect that this is a muscle strain/overuse injury.  I will treat her with a wrist brace to keep her wrist and forearm in neutral position along with rest, over-the-counter NSAIDs, and topical   Final Clinical Impressions(s) / UC Diagnoses   Final diagnoses:  Forearm strain, left, initial encounter     Discharge Instructions      Wear the wrist brace at all times except when bathing or washing your hands to help decrease muscle inflammation.  Take OTC Aleve or Ibuprofen as directed by the package to help with pain and inflammation.  You may apply ice to your forearm for 20 minutes at a time to help with pain and inflammation.  If your  symptoms do not improve follow up with Orthopedics.      ED Prescriptions   None    PDMP not reviewed this encounter.   Gloria Augusta, NP 06/23/22 1014

## 2022-06-23 NOTE — Discharge Instructions (Signed)
Wear the wrist brace at all times except when bathing or washing your hands to help decrease muscle inflammation.  Take OTC Aleve or Ibuprofen as directed by the package to help with pain and inflammation.  You may apply ice to your forearm for 20 minutes at a time to help with pain and inflammation.  If your symptoms do not improve follow up with Orthopedics.

## 2022-06-23 NOTE — ED Triage Notes (Signed)
Pt presents with left forearm pain that radiates down to her fingertips x 1 week. Pt denies any injury. She has tried OTC pain medication for her symptoms.

## 2022-11-11 ENCOUNTER — Ambulatory Visit
Admission: RE | Admit: 2022-11-11 | Discharge: 2022-11-11 | Disposition: A | Discharge status: Home / Self Care | Payer: Managed Care, Other (non HMO) | Source: Ambulatory Visit | Attending: Family Medicine | Admitting: Family Medicine

## 2022-11-11 VITALS — BP 146/88 | HR 84 | Temp 98.1°F | Resp 14 | Ht 61.0 in | Wt 130.1 lb

## 2022-11-11 DIAGNOSIS — J029 Acute pharyngitis, unspecified: Secondary | ICD-10-CM

## 2022-11-11 DIAGNOSIS — B370 Candidal stomatitis: Secondary | ICD-10-CM | POA: Diagnosis not present

## 2022-11-11 LAB — GROUP A STREP BY PCR: Group A Strep by PCR: NOT DETECTED

## 2022-11-11 MED ORDER — NYSTATIN 100000 UNIT/ML MT SUSP: 500000.0000 [IU] | Freq: Four times a day (QID) | OROMUCOSAL | 0 refills | Status: DC

## 2022-11-11 NOTE — ED Provider Notes (Signed)
[facility]    CSN: [id number] Arrival date & time: 11/11/22  1010      History   Chief Complaint Chief Complaint  Patient presents with   Sore Throat    Appointment    HPI Gloria Curry is a 62 y.o. female.   HPI  History obtained from the patient. Gloria Curry presents for sore throat and cold symptoms that started on Monday. Her cold symptoms are improving but the sore throat is persisting. Has cough, nasal congestion.  Has been gargling with warm salt water and using sore throat lozenges with some mild relief.  No fever, headache, ear pain, vomiting, diarrhea.   No known sick contacts.        Past Medical History:  Diagnosis Date   Hypertension     There are no problems to display for this patient.   Past Surgical History:  Procedure Laterality Date   ABDOMINAL HYSTERECTOMY     ECTOPIC PREGNANCY SURGERY      OB History   No obstetric history on file.      Home Medications    Prior to Admission medications   Medication Sig Start Date End Date Taking? Authorizing Provider  nystatin (MYCOSTATIN) 100000 UNIT/ML suspension Take 5 mLs (500,000 Units total) by mouth 4 (four) times daily. 11/11/22  Yes Gurpreet Mariani, DO  albuterol (VENTOLIN HFA) 108 (90 Base) MCG/ACT inhaler USE 2 INHALATIONS EVERY 4 HOURS AS NEEDED FOR WHEEZING 08/31/12   [provider]  amLODipine (NORVASC) 5 MG tablet Take 1 tablet by mouth daily. 03/18/20   [provider]  atorvastatin (LIPITOR) 10 MG tablet Take 1 tablet by mouth daily. 03/18/20   [provider]  lisinopril (ZESTRIL) 20 MG tablet Take 1 tablet by mouth daily. 03/18/20   [provider]  meclizine (ANTIVERT) 25 MG tablet Take by mouth. 11/06/09   [provider]  montelukast (SINGULAIR) 10 MG tablet Take by mouth. 04/16/20   [provider]  Multiple Vitamin (MULTI-VITAMIN) tablet Take 1 tablet by mouth daily.    [provider]  QVAR REDIHALER 80 MCG/ACT  inhaler Inhale into the lungs. 06/22/22   [provider]    Family History History reviewed. No pertinent family history.  Social History Social History   Tobacco Use   Smoking status: Never   Smokeless tobacco: Never  Vaping Use   Vaping status: Never Used  Substance Use Topics   Alcohol use: No   Drug use: Never     Allergies   Patient has no known allergies.   Review of Systems Review of Systems: negative unless otherwise stated in HPI.      Physical Exam Triage Vital Signs ED Triage Vitals  Encounter Vitals Group     BP 11/11/22 1042 (!) 146/88     Systolic BP Percentile --      Diastolic BP Percentile --      Pulse Rate 11/11/22 1042 84     Resp 11/11/22 1042 14     Temp 11/11/22 1042 98.1 F (36.7 C)     Temp Source 11/11/22 1042 Oral     SpO2 11/11/22 1042 100 %     Weight 11/11/22 1041 130 lb 1.1 oz (59 kg)     Height 11/11/22 1041 5\' 1"  (1.549 m)     Head Circumference --      Peak Flow --      Pain Score 11/11/22 1041 8     Pain Loc --  Pain Education --      Exclude from Growth Chart --    No data found.  Updated Vital Signs BP (!) 146/88 (BP Location: Left Arm)   Pulse 84   Temp 98.1 F (36.7 C) (Oral)   Resp 14   Ht 5\' 1"  (1.549 m)   Wt 59 kg   SpO2 100%   BMI 24.58 kg/m   Visual Acuity Right Eye Distance:   Left Eye Distance:   Bilateral Distance:    Right Eye Near:   Left Eye Near:    Bilateral Near:     Physical Exam GEN:     alert, non-toxic appearing female in no distress    HENT:  mucus membranes moist, oropharyngeal with white patches on buccal mucosal, mild erythema, no tonsils visible, no nasal discharge, right cerumen impaction, left TM normal  EYES:   pupils equal and reactive, no scleral injection or discharge NECK:  normal ROM,  RESP:  no increased work of breathing Skin:   warm and dry, no rash on visible skin    UC Treatments / Results  Labs (all labs ordered are listed, but only abnormal  results are displayed) Labs Reviewed  GROUP A STREP BY PCR    EKG   Radiology No results found.  Procedures Procedures (including critical care time)  Medications Ordered in UC Medications - No data to display  Initial Impression / Assessment and Plan / UC Course  I have reviewed the triage vital signs and the nursing notes.  Pertinent labs & imaging results that were available during my care of the patient were reviewed by me and considered in my medical decision making (see chart for details).       Pt is a 62 y.o. female who presents for ongoing sore throat.  [name] is afebrile here without recent antipyretics. Satting well on room air. Overall pt is non-toxic appearing, well hydrated, without respiratory distress. Pulmonary exam is unremarkable.  COVID testing deffered.  Strep PCR is negative.  On exam, she has evidence of white patches on buccal mucosa concerning for thrush. She doesn't rinse after using her Qvar asthma inhaler.  Suspect thrush pharyngitis.  Treat with Nystatin solution. Typical duration of symptoms discussed.   Return and ED precautions given and voiced understanding. Discussed MDM, treatment plan and plan for follow-up with patient who agrees with plan.     Final Clinical Impressions(s) / UC Diagnoses   Final diagnoses:  Pharyngitis, unspecified etiology  Thrush     Discharge Instructions      Your strep test is negative. You have evidence of thrush which I suspect is due to your asthma inhaler use.  Be sure to rinse your mouth after each use of your Qvar inhaler to prevent reoccurrence.      ED Prescriptions     Medication Sig Dispense Auth. Provider   nystatin (MYCOSTATIN) 100000 UNIT/ML suspension Take 5 mLs (500,000 Units total) by mouth 4 (four) times daily. 60 mL Katha Cabal, DO      PDMP not reviewed this encounter.   Katha Cabal, DO 11/11/22 1128

## 2022-11-11 NOTE — Discharge Instructions (Addendum)
Your strep test is negative. You have evidence of thrush which I suspect is due to your asthma inhaler use.  Be sure to rinse your mouth after each use of your Qvar inhaler to prevent reoccurrence.

## 2022-11-11 NOTE — ED Triage Notes (Signed)
Patient reports cold symptoms that started on Monday.  Patient states that her sore throat started on Wed and has not gone away.  Patient denies fevers.

## 2023-02-20 ENCOUNTER — Other Ambulatory Visit: Payer: Self-pay | Admitting: Family Medicine

## 2023-02-20 DIAGNOSIS — Z1231 Encounter for screening mammogram for malignant neoplasm of breast: Secondary | ICD-10-CM

## 2023-03-01 ENCOUNTER — Ambulatory Visit
Admission: RE | Admit: 2023-03-01 | Discharge: 2023-03-01 | Disposition: A | Discharge status: Home / Self Care | Payer: Managed Care, Other (non HMO) | Source: Ambulatory Visit | Attending: Family Medicine | Admitting: Family Medicine

## 2023-03-01 DIAGNOSIS — Z1231 Encounter for screening mammogram for malignant neoplasm of breast: Secondary | ICD-10-CM | POA: Insufficient documentation

## 2023-12-03 ENCOUNTER — Telehealth: Admitting: Physician Assistant

## 2023-12-03 DIAGNOSIS — B370 Candidal stomatitis: Secondary | ICD-10-CM | POA: Diagnosis not present

## 2023-12-03 MED ORDER — NYSTATIN 100000 UNIT/ML MT SUSP: 500000.0000 [IU] | Freq: Four times a day (QID) | OROMUCOSAL | 0 refills | Status: DC

## 2023-12-03 NOTE — Patient Instructions (Signed)
  Darice Majestic, thank you for joining Teena Shuck, PA-C for today's virtual visit.  While this provider is not your primary care provider (PCP), if your PCP is located in our provider database this encounter information will be shared with them immediately following your visit.   A [facility] MyChart account gives you access to today's visit and all your visits, tests, and labs performed at [facility]  click here if you don't have a [facility] MyChart account or go to [email or web address]  Consent: (Patient) Gloria Curry provided verbal consent for this virtual visit at the beginning of the encounter.  Current Medications:  Current Outpatient Medications:    albuterol (VENTOLIN HFA) 108 (90 Base) MCG/ACT inhaler, USE 2 INHALATIONS EVERY 4 HOURS AS NEEDED FOR WHEEZING, Disp: , Rfl:    amLODipine (NORVASC) 5 MG tablet, Take 1 tablet by mouth daily., Disp: , Rfl:    atorvastatin (LIPITOR) 10 MG tablet, Take 1 tablet by mouth daily., Disp: , Rfl:    lisinopril (ZESTRIL) 20 MG tablet, Take 1 tablet by mouth daily., Disp: , Rfl:    meclizine (ANTIVERT) 25 MG tablet, Take by mouth., Disp: , Rfl:    montelukast (SINGULAIR) 10 MG tablet, Take by mouth., Disp: , Rfl:    Multiple Vitamin (MULTI-VITAMIN) tablet, Take 1 tablet by mouth daily., Disp: , Rfl:    nystatin  (MYCOSTATIN ) 100000 UNIT/ML suspension, Take 5 mLs (500,000 Units total) by mouth 4 (four) times daily., Disp: 60 mL, Rfl: 0   QVAR REDIHALER 80 MCG/ACT inhaler, Inhale into the lungs., Disp: , Rfl:    Medications ordered in this encounter:  No orders of the defined types were placed in this encounter.    *If you need refills on other medications prior to your next appointment, please contact your pharmacy*  Follow-Up: Call back or seek an in-person evaluation if the symptoms worsen or if the condition fails to improve as anticipated.  [facility] [phone]  Other Instructions Please report  to the nearest Emergency room with any worsening symptoms. Follow up with primary care provider (PCP) in 2 -3 days.    If you have been instructed to have an in-person evaluation today at a local Urgent Care facility, please use the link below. It will take you to a list of all of our available [facility], including address, phone number and hours of operation. Please do not delay care.  [facility]  If you or a family member do not have a primary care provider, use the link below to schedule a visit and establish care. When you choose a [facility] primary care physician or advanced practice provider, you gain a long-term partner in health. Find a Primary Care Provider  Learn more about [facility]'s in-office and virtual care options: [facility] - Get Care Now

## 2023-12-03 NOTE — Progress Notes (Signed)
 Virtual Visit Consent   Gloria Curry, you are scheduled for a virtual visit with a [facility] provider today. Just as with appointments in the office, your consent must be obtained to participate. Your consent will be active for this visit and any virtual visit you may have with one of our providers in the next 365 days. If you have a MyChart account, a copy of this consent can be sent to you electronically.  As this is a virtual visit, video technology does not allow for your provider to perform a traditional examination. This may limit your provider's ability to fully assess your condition. If your provider identifies any concerns that need to be evaluated in person or the need to arrange testing (such as labs, EKG, etc.), we will make arrangements to do so. Although advances in technology are sophisticated, we cannot ensure that it will always work on either your end or our end. If the connection with a video visit is poor, the visit may have to be switched to a telephone visit. With either a video or telephone visit, we are not always able to ensure that we have a secure connection.  By engaging in this virtual visit, you consent to the provision of healthcare and authorize for your insurance to be billed (if applicable) for the services provided during this visit. Depending on your insurance coverage, you may receive a charge related to this service.  I need to obtain your verbal consent now. Are you willing to proceed with your visit today? Gloria Curry has provided verbal consent on 12/03/2023 for a virtual visit (video or telephone). Teena Shuck, [place]  Date: 12/03/2023 11:31 AM   Virtual Visit via Video Note   I, Teena Shuck, connected with  Gloria Curry  ([id number], 10/29/1960) on 12/03/23 at 11:30 AM EDT by a video-enabled telemedicine application and verified that I am speaking with the correct person using two identifiers.  Location: Patient: Virtual Visit Location Patient:  Home Provider: Virtual Visit Location Provider: Home Office   I discussed the limitations of evaluation and management by telemedicine and the availability of in person appointments. The patient expressed understanding and agreed to proceed.    History of Present Illness: Gloria Curry is a 63 y.o. who identifies as a female who was assigned female at birth, and is being seen today for oral thrush.  HPI: Mouth Lesions  The current episode started 2 days ago. The onset was gradual. The problem is mild. Nothing relieves the symptoms. Associated symptoms include mouth sores. She has been Eating and drinking normally. Urine output has been normal. She has received no recent medical care.    Problems: There are no active problems to display for this patient.   Allergies: No Known Allergies Medications:  Current Outpatient Medications:    albuterol (VENTOLIN HFA) 108 (90 Base) MCG/ACT inhaler, USE 2 INHALATIONS EVERY 4 HOURS AS NEEDED FOR WHEEZING, Disp: , Rfl:    amLODipine (NORVASC) 5 MG tablet, Take 1 tablet by mouth daily., Disp: , Rfl:    atorvastatin (LIPITOR) 10 MG tablet, Take 1 tablet by mouth daily., Disp: , Rfl:    lisinopril (ZESTRIL) 20 MG tablet, Take 1 tablet by mouth daily., Disp: , Rfl:    meclizine (ANTIVERT) 25 MG tablet, Take by mouth., Disp: , Rfl:    montelukast (SINGULAIR) 10 MG tablet, Take by mouth., Disp: , Rfl:    Multiple Vitamin (MULTI-VITAMIN) tablet, Take 1 tablet by mouth daily., Disp: , Rfl:    nystatin  (  MYCOSTATIN ) 100000 UNIT/ML suspension, Take 5 mLs (500,000 Units total) by mouth 4 (four) times daily., Disp: 60 mL, Rfl: 0   QVAR REDIHALER 80 MCG/ACT inhaler, Inhale into the lungs., Disp: , Rfl:   Observations/Objective: Patient is well-developed, well-nourished in no acute distress.  Resting comfortably  at home.  Head is normocephalic, atraumatic.  No labored breathing.  Speech is clear and coherent with logical content.  Patient is alert and oriented  at baseline.  White plaque on tongue   Assessment and Plan: 1. Oral candida (Primary)  Patient presenting with an exacerbation of oral thrush. Similar to previous episode, she is unaware of the inciting event prior to the infection. Instructed that we will treat with Nystatin  and on the need to follow up with PCP for further investigation as to why it keeps reoccurring. She verbalized understanding and agreement with this plan.   Follow Up Instructions: I discussed the assessment and treatment plan with the patient. The patient was provided an opportunity to ask questions and all were answered. The patient agreed with the plan and demonstrated an understanding of the instructions.  A copy of instructions were sent to the patient via MyChart unless otherwise noted below.    The patient was advised to call back or seek an in-person evaluation if the symptoms worsen or if the condition fails to improve as anticipated.    Teena Shuck, PA-C

## 2024-01-24 ENCOUNTER — Telehealth

## 2024-01-24 DIAGNOSIS — B370 Candidal stomatitis: Secondary | ICD-10-CM

## 2024-01-24 MED ORDER — NYSTATIN 100000 UNIT/ML MT SUSP: 5.0000 mL | Freq: Four times a day (QID) | OROMUCOSAL | 0 refills | Status: DC

## 2024-01-24 MED ORDER — FLUCONAZOLE 150 MG PO TABS: 150.0000 mg | ORAL_TABLET | ORAL | 0 refills | Status: DC

## 2024-01-24 NOTE — Patient Instructions (Signed)
 Darice Majestic, thank you for joining Delon CHRISTELLA Dickinson, PA-C for today's virtual visit.  While this provider is not your primary care provider (PCP), if your PCP is located in our provider database this encounter information will be shared with them immediately following your visit.   A [facility] MyChart account gives you access to today's visit and all your visits, tests, and labs performed at [facility]  click here if you don't have a [facility] MyChart account or go to [email or web address]  Consent: (Patient) Gloria Curry provided verbal consent for this virtual visit at the beginning of the encounter.  Current Medications:  Current Outpatient Medications:    fluconazole  (DIFLUCAN ) 150 MG tablet, Take 1 tablet (150 mg total) by mouth once a week., Disp: 6 tablet, Rfl: 0   nystatin  (MYCOSTATIN ) 100000 UNIT/ML suspension, Take 5 mLs (500,000 Units total) by mouth 4 (four) times daily., Disp: 240 mL, Rfl: 0   albuterol (VENTOLIN HFA) 108 (90 Base) MCG/ACT inhaler, USE 2 INHALATIONS EVERY 4 HOURS AS NEEDED FOR WHEEZING, Disp: , Rfl:    amLODipine (NORVASC) 5 MG tablet, Take 1 tablet by mouth daily., Disp: , Rfl:    atorvastatin (LIPITOR) 10 MG tablet, Take 1 tablet by mouth daily., Disp: , Rfl:    lisinopril (ZESTRIL) 20 MG tablet, Take 1 tablet by mouth daily., Disp: , Rfl:    meclizine (ANTIVERT) 25 MG tablet, Take by mouth., Disp: , Rfl:    montelukast (SINGULAIR) 10 MG tablet, Take by mouth., Disp: , Rfl:    Multiple Vitamin (MULTI-VITAMIN) tablet, Take 1 tablet by mouth daily., Disp: , Rfl:    QVAR REDIHALER 80 MCG/ACT inhaler, Inhale into the lungs., Disp: , Rfl:    Medications ordered in this encounter:  Meds ordered this encounter  Medications   nystatin  (MYCOSTATIN ) 100000 UNIT/ML suspension    Sig: Take 5 mLs (500,000 Units total) by mouth 4 (four) times daily.    Dispense:  240 mL    Refill:  0    Supervising Provider:   BLAISE ALEENE KIDD [[id number]]    fluconazole  (DIFLUCAN ) 150 MG tablet    Sig: Take 1 tablet (150 mg total) by mouth once a week.    Dispense:  6 tablet    Refill:  0    Supervising Provider:   LAMPTEY, PHILIP O [[id number]]     *If you need refills on other medications prior to your next appointment, please contact your pharmacy*  Follow-Up: Call back or seek an in-person evaluation if the symptoms worsen or if the condition fails to improve as anticipated.  [facility] [phone]  Other Instructions Oral Thrush, Adult Oral thrush, also called oral candidiasis, is a fungal infection that develops in the mouth and throat and on the tongue. It causes white patches to form in the mouth and on the tongue. Many cases of thrush are mild, but this infection can also be serious. [place] can be a repeated (recurrent) problem for certain people who have a weak body defense system (immune system). The weakness can be caused by chronic illnesses, or by taking medicines that limit the body's ability to fight infection. If a person has difficulty fighting infection, the fungus that causes thrush can spread through the body. This can cause life-threatening blood or organ infections. What are the causes? This condition is caused by a fungus (yeast) called Candida albicans. This fungus is normally present in small amounts in the mouth and on other mucous membranes. It usually  causes no harm. If conditions are present that allow the fungus to grow without control, it invades surrounding tissues and becomes an infection. Other Candida species can also lead to thrush, though this is rare. What increases the risk? The following factors may make you more likely to develop this condition: Having a weakened immune system. Being an older adult. Having diabetes, cancer, or HIV (human immunodeficiency virus). Having dry mouth (xerostomia). Being pregnant or breastfeeding. Having poor dental care, especially in those who have  dentures. Using antibiotic or steroid medicines. What are the signs or symptoms? Symptoms of this condition can vary from mild and moderate to severe and persistent. Symptoms may include: A burning feeling in the mouth and throat. This can occur at the start of a thrush infection. White patches that stick to the mouth and tongue. The tissue around the patches may be red, raw, and painful. If rubbed (during tooth brushing, for example), the patches and the tissue of the mouth may bleed easily. A bad taste in the mouth or difficulty tasting foods. A cottony feeling in the mouth. Pain during eating and swallowing. Poor appetite. Cracking at the corners of the mouth. How is this diagnosed? This condition is diagnosed based on: A physical exam. Your medical history. How is this treated? This condition is treated with medicines called antifungals, which prevent the growth of fungi. These medicines are either applied directly to the affected area (topical) or swallowed (oral). The treatment will depend on the severity of the condition. Mild cases of thrush may be treated with an antifungal mouth rinse or lozenges. Treatment usually lasts about 14 days. Moderate to severe cases of thrush can be treated with oral antifungal medicine, if they have spread to the esophagus. A topical antifungal medicine may also be used. For some severe infections, treatment may need to continue for more than 14 days. Oral antifungal medicines are rarely used during pregnancy because they may be harmful to the unborn child. If you are pregnant, talk with your health care provider about options for treatment. Persistent or recurrent thrush. For cases of thrush that do not go away or keep coming back: Treatment may be needed twice as long as the symptoms last. Treatment will include both oral and topical antifungal medicines. People with a weakened immune system can take an antifungal medicine on a continuous basis to  prevent thrush infections. It is important to treat conditions that make a person more likely to get thrush, such as diabetes or HIV. Follow these instructions at home: Relieving soreness and discomfort To help reduce the discomfort of thrush: Drink cold liquids such as water or iced tea. Try flavored ice treats or frozen juices. Eat foods that are easy to swallow, such as gelatin, ice cream, or custard. Try drinking from a straw if the patches in your mouth are painful.  General instructions Take or use over-the-counter and prescription medicines only as told by your health care provider. Eat plain, unflavored yogurt as directed by your health care provider. Check the label to make sure the yogurt contains live cultures. This yogurt can help healthy bacteria grow in the mouth and can stop the growth of the fungus that causes thrush. If you wear dentures, remove the dentures before going to bed, brush them vigorously, and soak them in a cleaning solution as directed by your health care provider. Rinse your mouth with a warm salt-water mixture several times a day. To make a salt-water mixture, dissolve -1 tsp (3-6 g) of  salt in 1 cup (237 mL) of warm water. Contact a health care provider if: Your symptoms are getting worse or are not improving within 7 days of starting treatment. You have symptoms of a spreading infection, such as white patches on the skin outside of the mouth. You are breastfeeding your baby and you have redness and pain in the nipples. Summary Oral thrush, also called oral candidiasis, is a fungal infection that develops in the mouth and throat and on the tongue. It causes white patches to form in the mouth and on the tongue. You are more likely to get this condition if you have a weakened immune system or an underlying condition, such as HIV, cancer, or diabetes. This condition is treated with medicines called antifungals, which prevent the growth of fungi. Contact a health  care provider if your symptoms do not improve, or get worse, within 7 days of starting treatment. This information is not intended to replace advice given to you by your health care provider. Make sure you discuss any questions you have with your health care provider. Document Revised: 01/10/2022 Document Reviewed: 01/10/2022 Elsevier Patient Education  2024 Elsevier Inc.   If you have been instructed to have an in-person evaluation today at a local Urgent Care facility, please use the link below. It will take you to a list of all of our available [facility], including address, phone number and hours of operation. Please do not delay care.  [facility]  If you or a family member do not have a primary care provider, use the link below to schedule a visit and establish care. When you choose a [facility] primary care physician or advanced practice provider, you gain a long-term partner in health. Find a Primary Care Provider  Learn more about [facility]'s in-office and virtual care options: [facility] - Get Care Now

## 2024-01-24 NOTE — Progress Notes (Signed)
 Virtual Visit Consent   Gloria Curry, you are scheduled for a virtual visit with a [facility] provider today. Just as with appointments in the office, your consent must be obtained to participate. Your consent will be active for this visit and any virtual visit you may have with one of our providers in the next 365 days. If you have a MyChart account, a copy of this consent can be sent to you electronically.  As this is a virtual visit, video technology does not allow for your provider to perform a traditional examination. This may limit your provider's ability to fully assess your condition. If your provider identifies any concerns that need to be evaluated in person or the need to arrange testing (such as labs, EKG, etc.), we will make arrangements to do so. Although advances in technology are sophisticated, we cannot ensure that it will always work on either your end or our end. If the connection with a video visit is poor, the visit may have to be switched to a telephone visit. With either a video or telephone visit, we are not always able to ensure that we have a secure connection.  By engaging in this virtual visit, you consent to the provision of healthcare and authorize for your insurance to be billed (if applicable) for the services provided during this visit. Depending on your insurance coverage, you may receive a charge related to this service.  I need to obtain your verbal consent now. Are you willing to proceed with your visit today? Gloria Curry has provided verbal consent on 01/24/2024 for a virtual visit (video or telephone). Delon CHRISTELLA Dickinson, PA-C  Date: 01/24/2024 7:03 PM   Virtual Visit via Video Note   I, Delon CHRISTELLA Dickinson, connected with  Gloria Curry  ([id number], 1960/03/27) on 01/24/2024 at  7:00 PM EST by a video-enabled telemedicine application and verified that I am speaking with the correct person using two identifiers.  Location: Patient: Virtual Visit Location  Patient: Home Provider: Virtual Visit Location Provider: Home Office   I discussed the limitations of evaluation and management by telemedicine and the availability of in person appointments. The patient expressed understanding and agreed to proceed.    History of Present Illness: Gloria Curry is a 63 y.o. who identifies as a female who was assigned female at birth, and is being seen today for recurrent thrush. Started over the summer 2025. Was seen at [facility] in person and given Nystatin  suspension. Symptoms improved but recurred by Oct 2025. She sought care again, this time [facility], and was given Nystatin  again. It did clear up, but over the last week has been worsening and she feels it is the worst it has ever been.   She did have her CPE with her PCP in 10/2023 and labs were unremarkable. A1c was 5.1.   Problems: There are no active problems to display for this patient.   Allergies: Allergies[1] Medications: Current Medications[2]  Observations/Objective: Patient is well-developed, well-nourished in no acute distress.  Resting comfortably at home.  Head is normocephalic, atraumatic.  No labored breathing.  Speech is clear and coherent with logical content.  Patient is alert and oriented at baseline.    Assessment and Plan: 1. Oral thrush (Primary) - nystatin  (MYCOSTATIN ) 100000 UNIT/ML suspension; Take 5 mLs (500,000 Units total) by mouth 4 (four) times daily.  Dispense: 240 mL; Refill: 0 - fluconazole  (DIFLUCAN ) 150 MG tablet; Take 1 tablet (150 mg total) by mouth once a week.  Dispense: 6 tablet; Refill:  0  - Will treat for thrush once more using Nystatin  for topical treatment and Fluconazole  once weekly for 6 weeks for oral treatment - If symptoms recur again, she has been advised to strictly follow up with her PCP to determine a source for this and confirm it is thrush - She voiced understanding and agrees  Follow Up Instructions: I discussed the assessment and treatment plan  with the patient. The patient was provided an opportunity to ask questions and all were answered. The patient agreed with the plan and demonstrated an understanding of the instructions.  A copy of instructions were sent to the patient via MyChart unless otherwise noted below.    The patient was advised to call back or seek an in-person evaluation if the symptoms worsen or if the condition fails to improve as anticipated.    Delon HERO Cherity Blickenstaff, PA-C     [1] No Known Allergies [2]  Current Outpatient Medications:    fluconazole  (DIFLUCAN ) 150 MG tablet, Take 1 tablet (150 mg total) by mouth once a week., Disp: 6 tablet, Rfl: 0   nystatin  (MYCOSTATIN ) 100000 UNIT/ML suspension, Take 5 mLs (500,000 Units total) by mouth 4 (four) times daily., Disp: 240 mL, Rfl: 0   albuterol (VENTOLIN HFA) 108 (90 Base) MCG/ACT inhaler, USE 2 INHALATIONS EVERY 4 HOURS AS NEEDED FOR WHEEZING, Disp: , Rfl:    amLODipine (NORVASC) 5 MG tablet, Take 1 tablet by mouth daily., Disp: , Rfl:    atorvastatin (LIPITOR) 10 MG tablet, Take 1 tablet by mouth daily., Disp: , Rfl:    lisinopril (ZESTRIL) 20 MG tablet, Take 1 tablet by mouth daily., Disp: , Rfl:    meclizine (ANTIVERT) 25 MG tablet, Take by mouth., Disp: , Rfl:    montelukast (SINGULAIR) 10 MG tablet, Take by mouth., Disp: , Rfl:    Multiple Vitamin (MULTI-VITAMIN) tablet, Take 1 tablet by mouth daily., Disp: , Rfl:    QVAR REDIHALER 80 MCG/ACT inhaler, Inhale into the lungs., Disp: , Rfl:

## 2024-03-06 ENCOUNTER — Encounter: Payer: Self-pay | Admitting: Nurse Practitioner

## 2024-03-06 ENCOUNTER — Telehealth

## 2024-03-06 ENCOUNTER — Other Ambulatory Visit: Payer: Self-pay | Admitting: Family Medicine

## 2024-03-06 DIAGNOSIS — J4530 Mild persistent asthma, uncomplicated: Secondary | ICD-10-CM

## 2024-03-06 DIAGNOSIS — Z1231 Encounter for screening mammogram for malignant neoplasm of breast: Secondary | ICD-10-CM

## 2024-03-06 DIAGNOSIS — B370 Candidal stomatitis: Secondary | ICD-10-CM

## 2024-03-06 MED ORDER — SPACER/AERO-HOLDING CHAMBERS DEVI: 1 refills | Status: AC

## 2024-03-06 MED ORDER — FLUCONAZOLE 200 MG PO TABS: 200.0000 mg | ORAL_TABLET | Freq: Every day | ORAL | 0 refills | Status: AC

## 2024-03-06 NOTE — Progress Notes (Signed)
 " Virtual Visit Consent   Prisma Decarlo, you are scheduled for a virtual visit with a  provider today. Just as with appointments in the office, your consent must be obtained to participate. Your consent will be active for this visit and any virtual visit you may have with one of our providers in the next 365 days. If you have a MyChart account, a copy of this consent can be sent to you electronically.  As this is a virtual visit, video technology does not allow for your provider to perform a traditional examination. This may limit your provider's ability to fully assess your condition. If your provider identifies any concerns that need to be evaluated in person or the need to arrange testing (such as labs, EKG, etc.), we will make arrangements to do so. Although advances in technology are sophisticated, we cannot ensure that it will always work on either your end or our end. If the connection with a video visit is poor, the visit may have to be switched to a telephone visit. With either a video or telephone visit, we are not always able to ensure that we have a secure connection.  By engaging in this virtual visit, you consent to the provision of healthcare and authorize for your insurance to be billed (if applicable) for the services provided during this visit. Depending on your insurance coverage, you may receive a charge related to this service.  I need to obtain your verbal consent now. Are you willing to proceed with your visit today? Gloria Curry has provided verbal consent on 03/06/2024 for a virtual visit (video or telephone). Comer LULLA Rouleau, NP  Date: 03/06/2024 12:02 PM   Virtual Visit via Video Note   I, Comer LULLA Rouleau, connected with  Gloria Curry  ([id number], August 14, 1960) on 03/06/24 at 12:00 PM EST by a video-enabled telemedicine application and verified that I am speaking with the correct person using two identifiers.  Location: Patient: Virtual Visit Location Patient:  Home Provider: Virtual Visit Location Provider: Home Office   I discussed the limitations of evaluation and management by telemedicine and the availability of in person appointments. The patient expressed understanding and agreed to proceed.    History of Present Illness: Gloria Curry is a 64 y.o. who identifies as a female who was assigned female at birth, and is being seen today for oral thrush.  Patient reports recurrent symptoms of oral thrush. She was initially treated in October for similar symptoms with Nystatin . The symptoms resolved but returned in December, and she has had difficulties clearing since. She did do another course of nystatin  oral rinses in December but continued to have white plaques and dry mouth. She saw her PCP 1/22 who evaluated her and treated her with 3 days of diflucan  150 mg. The symptoms started to resolve, but she has been off for a few days and they seem to be returning. She has a white coating to her mouth and white plaques to the back of the mouth. No sore throat, tenderness, difficulties swallowing, changes in taste, fevers, blisters. Eating and drinking normally. No other associated symptoms.  She does use Qvar daily and reports oral hygiene following inhaler use. No new medications otherwise.   HPI: HPI  Problems:  Patient Active Problem List   Diagnosis Date Noted   Asthma 07/30/2013    Allergies: Allergies[1] Medications: Current Medications[2]  Observations/Objective: Patient is well-developed, well-nourished in no acute distress.  Resting comfortably  at home.  Head is normocephalic, atraumatic.  Unable to visualize oropharynx. Tongue pink and moist.  No labored breathing.  Speech is clear and coherent with logical content.  Patient is alert and oriented at baseline.   Assessment and Plan: 1. Oral thrush (Primary) - fluconazole  (DIFLUCAN ) 200 MG tablet; Take 1 tablet (200 mg total) by mouth daily for 7 days.  Dispense: 7 tablet; Refill:  0  Recurrent oral thrush symptoms with initial improvement on fluconazole  PO. Will need extended 7 day course. Advised that if she has return of symptoms or fails to improve, further in person evaluation with consideration for alternative diagnoses needs to occur. Reviewed strict oral hygiene measures given ICS use. Will also send in spacer for her to utilize with Qvar.   Follow Up Instructions: I discussed the assessment and treatment plan with the patient. The patient was provided an opportunity to ask questions and all were answered. The patient agreed with the plan and demonstrated an understanding of the instructions.  A copy of instructions were sent to the patient via MyChart unless otherwise noted below.     The patient was advised to call back or seek an in-person evaluation if the symptoms worsen or if the condition fails to improve as anticipated.    Comer LULLA Rouleau, NP     [1] No Known Allergies [2]  Current Outpatient Medications:    fluconazole  (DIFLUCAN ) 200 MG tablet, Take 1 tablet (200 mg total) by mouth daily for 7 days., Disp: 7 tablet, Rfl: 0   albuterol (VENTOLIN HFA) 108 (90 Base) MCG/ACT inhaler, USE 2 INHALATIONS EVERY 4 HOURS AS NEEDED FOR WHEEZING, Disp: , Rfl:    amLODipine (NORVASC) 5 MG tablet, Take 1 tablet by mouth daily., Disp: , Rfl:    atorvastatin (LIPITOR) 10 MG tablet, Take 1 tablet by mouth daily., Disp: , Rfl:    lisinopril (ZESTRIL) 20 MG tablet, Take 1 tablet by mouth daily., Disp: , Rfl:    montelukast (SINGULAIR) 10 MG tablet, Take by mouth., Disp: , Rfl:    Multiple Vitamin (MULTI-VITAMIN) tablet, Take 1 tablet by mouth daily., Disp: , Rfl:    QVAR REDIHALER 80 MCG/ACT inhaler, Inhale into the lungs., Disp: , Rfl:   "

## 2024-03-06 NOTE — Patient Instructions (Signed)
 " Gloria Curry, thank you for joining Comer Gloria Rouleau, NP for today's virtual visit.  While this provider is not your primary care provider (PCP), if your PCP is located in our provider database this encounter information will be shared with them immediately following your visit.   A [facility] MyChart account gives you access to today's visit and all your visits, tests, and labs performed at [facility]  click here if you don't have a Gloria Curry MyChart account or go to [email or web address]  Consent: (Patient) Gloria Curry provided verbal consent for this virtual visit at the beginning of the encounter.  Current Medications:  Current Outpatient Medications:    fluconazole  (DIFLUCAN ) 200 MG tablet, Take 1 tablet (200 mg total) by mouth daily for 7 days., Disp: 7 tablet, Rfl: 0   albuterol (VENTOLIN HFA) 108 (90 Base) MCG/ACT inhaler, USE 2 INHALATIONS EVERY 4 HOURS AS NEEDED FOR WHEEZING, Disp: , Rfl:    amLODipine (NORVASC) 5 MG tablet, Take 1 tablet by mouth daily., Disp: , Rfl:    atorvastatin (LIPITOR) 10 MG tablet, Take 1 tablet by mouth daily., Disp: , Rfl:    lisinopril (ZESTRIL) 20 MG tablet, Take 1 tablet by mouth daily., Disp: , Rfl:    montelukast (SINGULAIR) 10 MG tablet, Take by mouth., Disp: , Rfl:    Multiple Vitamin (MULTI-VITAMIN) tablet, Take 1 tablet by mouth daily., Disp: , Rfl:    QVAR REDIHALER 80 MCG/ACT inhaler, Inhale into the lungs., Disp: , Rfl:    Medications ordered in this encounter:  Meds ordered this encounter  Medications   fluconazole  (DIFLUCAN ) 200 MG tablet    Sig: Take 1 tablet (200 mg total) by mouth daily for 7 days.    Dispense:  7 tablet    Refill:  0     *If you need refills on other medications prior to your next appointment, please contact your pharmacy*  Follow-Up: Call back or seek an in-person evaluation if the symptoms worsen or if the condition fails to improve as anticipated.  [facility] [phone]  Other Instructions Given your symptoms and use of an inhaled steroid, you seem to have recurrent thrush that has failed topical therapies. It is promising that you improved with oral fluconazole ; however, it seems you did not have a long enough course based on returning symptoms and evidence based guidelines. I have prescribed fluconazole  200 mg once daily for 7 days for you to take. If the symptoms return after this, I would recommend further evaluation and consider changing your inhaler therapy, if possible, with the prescribing provider. Do not stop your inhaler without discussing with your doctor. Ensure you are cleaning your mouth well, including brushing the tongue, after you use your inhaler. Utilize and alcohol-free mouthwash. You can also use baking soda, mix with water to create a paste, and apply this to the tongue/mouth then gargle with water and spit to help regulate the pH in the mouth after using your Qvar.  Hope things get better soon!    If you have been instructed to have an in-person evaluation today at a local Urgent Care facility, please use the link below. It will take you to a list of all of our available [facility], including address, phone number and hours of operation. Please do not delay care.  [facility]  If you or a family member do not have a primary care provider, use the link below to schedule a visit  and establish care. When you choose a [facility] primary care physician or advanced practice provider, you gain a long-term partner in health. Find a Primary Care Provider  Learn more about [facility]'s in-office and virtual care options:  - Get Care Now  "

## 2024-03-20 ENCOUNTER — Encounter
